# Patient Record
Sex: Male | Born: 1985 | Race: Black or African American | Hispanic: No | Smoking: Current every day smoker
Health system: Southern US, Community
[De-identification: ages and names within clinical notes are randomized; demographics above are authoritative.]

---

## 2018-03-19 ENCOUNTER — Emergency Department (HOSPITAL_COMMUNITY): Payer: Self-pay

## 2018-03-19 ENCOUNTER — Emergency Department (HOSPITAL_COMMUNITY)
Admission: EM | Admit: 2018-03-19 | Discharge: 2018-03-19 | Disposition: A | Discharge status: Home / Self Care | Payer: Self-pay | Attending: Emergency Medicine | Admitting: Emergency Medicine

## 2018-03-19 ENCOUNTER — Encounter (HOSPITAL_COMMUNITY): Payer: Self-pay | Admitting: Emergency Medicine

## 2018-03-19 ENCOUNTER — Other Ambulatory Visit: Payer: Self-pay

## 2018-03-19 DIAGNOSIS — F17210 Nicotine dependence, cigarettes, uncomplicated: Secondary | ICD-10-CM | POA: Insufficient documentation

## 2018-03-19 DIAGNOSIS — R042 Hemoptysis: Secondary | ICD-10-CM

## 2018-03-19 DIAGNOSIS — J069 Acute upper respiratory infection, unspecified: Secondary | ICD-10-CM | POA: Insufficient documentation

## 2018-03-19 DIAGNOSIS — B9789 Other viral agents as the cause of diseases classified elsewhere: Secondary | ICD-10-CM

## 2018-03-19 MED ORDER — FLUTICASONE PROPIONATE 50 MCG/ACT NA SUSP: 2.0000 | Freq: Every day | NASAL | 0 refills | Status: AC

## 2018-03-19 MED ORDER — BENZONATATE 100 MG PO CAPS: 100.0000 mg | ORAL_CAPSULE | Freq: Three times a day (TID) | ORAL | 0 refills | Status: AC | PRN

## 2018-03-19 NOTE — Discharge Instructions (Addendum)
Drink plenty fluids and get plenty of rest.  Use Flonase for nasal congestion and Tessalon as needed for cough.  Try to quit smoking if you can.  He can also use over-the-counter cold medicines that may be helpful for your cough and nasal congestion.  Wear protective equipment while at work. Follow-up with a primary care physician for reevaluation of your symptoms.  I have attached information for [facility].  You can call them tomorrow morning and tell them you were referred from the emergency department.  Return to the emergency department if any concerning signs or symptoms develop such as fevers, chest pains, worsening shortness of breath

## 2018-03-19 NOTE — ED Provider Notes (Signed)
[facility] EMERGENCY DEPARTMENT Provider Note   CSN: [id number] Arrival date & time: 03/19/18  1654     History   Chief Complaint Chief Complaint  Patient presents with  . Cough    HPI Todd Bush is a 32 y.o. male with no significant past medical history presents today for evaluation of acute onset, persistent cough for 3 weeks.  He notes cough is productive of yellow sputum occasionally with black specks.  He states that after particularly aggressive/persistent coughing fits he will sometimes cough blood-streaked sputum.  He will feel short of breath during these coughing fits but denies shortness of breath otherwise.  He endorses generalized myalgias and aching soreness of the anterior chest wall with cough only.  Denies fevers, chills, nausea, or vomiting.  He does endorse mild sore throat and nasal congestion.  States that his family member had similar symptoms but not to this extent.  He has not tried anything for his symptoms.  He does work around a lot of dust and uses chemicals at work but does not wear a mask.  He is a current smoker of 4 to 5 cigarettes daily.  Denies recent travel or surgeries, no history of DVT or PE, not on hormonal placement therapy.  The history is provided by the patient.    History reviewed. No pertinent past medical history.  There are no active problems to display for this patient.   History reviewed. No pertinent surgical history.      Home Medications    Prior to Admission medications   Medication Sig Start Date End Date Taking? Authorizing Provider  benzonatate (TESSALON) 100 MG capsule Take 1 capsule (100 mg total) by mouth 3 (three) times daily as needed for cough. 03/19/18   Landers Prajapati A, PA-C  fluticasone (FLONASE) 50 MCG/ACT nasal spray Place 2 sprays into both nostrils daily. 03/19/18   Jeanie Sewer, PA-C    Family History No family history on file.  Social History Social History   Tobacco Use  .  Smoking status: Current Every Day Smoker  . Smokeless tobacco: Never Used  Substance Use Topics  . Alcohol use: Not Currently  . Drug use: Not on file     Allergies   Patient has no known allergies.   Review of Systems Review of Systems  Constitutional: Negative for chills and fever.  HENT: Positive for congestion and sore throat.   Respiratory: Positive for cough and shortness of breath (with cough only).   Cardiovascular: Positive for chest pain (with cough only).  All other systems reviewed and are negative.     Physical Exam Updated Vital Signs BP (!) 134/93 (BP Location: Right Arm)   Pulse (!) 51   Temp 98.2 F (36.8 C) (Oral)   Resp 12   Ht 5\' 7"  (1.702 m)   Wt 81.6 kg   SpO2 100%   BMI 28.19 kg/m   Physical Exam  Constitutional: He appears well-developed and well-nourished. No distress.  Resting comfortably in chair in no apparent distress  HENT:  Head: Normocephalic and atraumatic.  Right Ear: Tympanic membrane, external ear and ear canal normal.  Left Ear: Tympanic membrane, external ear and ear canal normal.  Nose: Mucosal edema present. No sinus tenderness or septal deviation. Right sinus exhibits no maxillary sinus tenderness and no frontal sinus tenderness. Left sinus exhibits no maxillary sinus tenderness and no frontal sinus tenderness.  Mouth/Throat: Uvula is midline and oropharynx is clear and moist. No trismus in the  jaw. No tonsillar exudate.  No tonsillar hypertrophy, no exudates, tolerating secretions without difficulty.  No sublingual, subglossal, or submental abnormalities.  Eyes: Pupils are equal, round, and reactive to light. Conjunctivae and EOM are normal. Right eye exhibits no discharge. Left eye exhibits no discharge.  Neck: Normal range of motion. Neck supple. No JVD present. No tracheal deviation present.  Cardiovascular: Normal rate, regular rhythm and normal heart sounds.  Pulmonary/Chest: Effort normal and breath sounds normal. No  stridor. No respiratory distress. He has no wheezes. He has no rales. He exhibits no tenderness.  Equal rise and fall of chest, no increased work of breathing.  Speaking in full sentences without difficulty.  Abdominal: Soft. Bowel sounds are normal. He exhibits no distension. There is no tenderness. There is no guarding.  Musculoskeletal: He exhibits no edema.  Lymphadenopathy:    He has no cervical adenopathy.  Neurological: He is alert.  Skin: Skin is warm and dry. No erythema.  Psychiatric: He has a normal mood and affect. His behavior is normal.  Nursing note and vitals reviewed.    ED Treatments / Results  Labs (all labs ordered are listed, but only abnormal results are displayed) Labs Reviewed - No data to display  EKG None  Radiology Dg Chest 2 View  Result Date: 03/19/2018 CLINICAL DATA:  Productive cough and central chest pain. EXAM: CHEST - 2 VIEW COMPARISON:  None. FINDINGS: Cardiomediastinal silhouette is normal. Mediastinal contours appear intact. There is no evidence of focal airspace consolidation, pleural effusion or pneumothorax. Osseous structures are without acute abnormality. Soft tissues are grossly normal. IMPRESSION: No active cardiopulmonary disease. Electronically Signed   By: Ted Mcalpineobrinka  Dimitrova M.D.   On: 03/19/2018 18:28    Procedures Procedures (including critical care time)  Medications Ordered in ED Medications - No data to display   Initial Impression / Assessment and Plan / ED Course  I have reviewed the triage vital signs and the nursing notes.  Pertinent labs & imaging results that were available during my care of the patient were reviewed by me and considered in my medical decision making (see chart for details).     Patient with complaint of productive cough and nasal congestion for 3 weeks.  He is afebrile, vital signs are stable.  He is nontoxic in appearance.  Lungs clear to auscultation.  He exhibits no increased work of breathing,  speaking in full sentences without difficulty.  He is PERC negative and I suspect that the blood streaking with cough is epithelial injury secondary to persistent cough.  Doubt PE, pneumonia, pleural effusion, dissection, pericarditis, myocarditis.  Chest pain appears to be musculoskeletal in etiology secondary to persistent cough, doubt ACS/MI.  Pt CXR negative for acute infiltrate. Patients symptoms are consistent with URI, likely viral etiology. Discussed that antibiotics are not indicated for viral infections. Pt will be discharged with symptomatic treatment.  Encourage smoking cessation.  Recommend follow-up with PCP.  He was given information for [facility].  Discussed strict ED return precautions.  Patient and patient's wife verbalized understanding of and agreement with plan and patient is stable for discharge home at this time.  Final Clinical Impressions(s) / ED Diagnoses   Final diagnoses:  Cough with hemoptysis  Viral URI with cough    ED Discharge Orders         Ordered    fluticasone (FLONASE) 50 MCG/ACT nasal spray  Daily     03/19/18 2014    benzonatate (TESSALON) 100 MG capsule  3 times daily PRN     03/19/18 2014           Bennye AlmFawze, Bobbie Valletta A, PA-C 03/19/18 2036    Wynetta FinesMessick, Peter C, MD 03/20/18 0001

## 2018-03-19 NOTE — ED Notes (Signed)
Patient verbalizes understanding of discharge instructions. Opportunity for questioning and answers were provided. Armband removed by staff, pt discharged from ED ambulatory.   

## 2018-03-19 NOTE — ED Triage Notes (Signed)
Pt has been coughing up blood in a coughing "fit" for 3 weeks. Pt states he feels normal aside from cough.

## 2018-03-19 NOTE — ED Provider Notes (Signed)
Patient placed in Quick Look pathway, seen and evaluated   Chief Complaint: cough  HPI:  Todd Bush is a 32 y.o. male who presents to the ED with a cough. The cough started 3 weeks ago. Patient reports coughing very hard and having thick yellow sputum followed by blood. Patient reports he works in a lot of dust and uses chemicals at work and does not wear a mask. Patient reports pain in the chest with cough only and reports that otherwise he feels normal.   ROS: Resp: cough  Physical Exam:  Pulse 78   Temp 98.2 F (36.8 C) (Oral)   Resp 20   Ht 5\' 7"  (1.702 m)   Wt 81.6 kg   SpO2 100%   BMI 28.19 kg/m    Gen: No distress  Neuro: Awake and Alert  Skin: Warm and dry  Lungs: occasional rales RLL  Heart: regular rate and rhythm    Initiation of care has begun. The patient has been counseled on the process, plan, and necessity for staying for the completion/evaluation, and the remainder of the medical screening examination    Janne Napoleoneese, Hope M, NP 03/19/18 1732    Lorre NickAllen, Anthony, MD 03/19/18 2240

## 2018-07-07 ENCOUNTER — Encounter (HOSPITAL_COMMUNITY): Payer: Self-pay

## 2018-07-07 ENCOUNTER — Emergency Department (HOSPITAL_COMMUNITY)
Admission: EM | Admit: 2018-07-07 | Discharge: 2018-07-07 | Disposition: A | Discharge status: Home / Self Care | Payer: Self-pay | Attending: Emergency Medicine | Admitting: Emergency Medicine

## 2018-07-07 ENCOUNTER — Other Ambulatory Visit: Payer: Self-pay

## 2018-07-07 DIAGNOSIS — Y999 Unspecified external cause status: Secondary | ICD-10-CM | POA: Insufficient documentation

## 2018-07-07 DIAGNOSIS — Z23 Encounter for immunization: Secondary | ICD-10-CM | POA: Insufficient documentation

## 2018-07-07 DIAGNOSIS — Y929 Unspecified place or not applicable: Secondary | ICD-10-CM | POA: Insufficient documentation

## 2018-07-07 DIAGNOSIS — Z79899 Other long term (current) drug therapy: Secondary | ICD-10-CM | POA: Insufficient documentation

## 2018-07-07 DIAGNOSIS — S1191XA Laceration without foreign body of unspecified part of neck, initial encounter: Secondary | ICD-10-CM | POA: Insufficient documentation

## 2018-07-07 DIAGNOSIS — Y9389 Activity, other specified: Secondary | ICD-10-CM | POA: Insufficient documentation

## 2018-07-07 DIAGNOSIS — F17200 Nicotine dependence, unspecified, uncomplicated: Secondary | ICD-10-CM | POA: Insufficient documentation

## 2018-07-07 MED ORDER — LIDOCAINE-EPINEPHRINE-TETRACAINE (LET) SOLUTION
3.0000 mL | Freq: Once | NASAL | Status: AC
  Administered 2018-07-07: 3 mL via TOPICAL
  Filled 2018-07-07: qty 3

## 2018-07-07 MED ORDER — TETANUS-DIPHTH-ACELL PERTUSSIS 5-2.5-18.5 LF-MCG/0.5 IM SUSP
0.5000 mL | Freq: Once | INTRAMUSCULAR | Status: AC
  Administered 2018-07-07: 0.5 mL via INTRAMUSCULAR
  Filled 2018-07-07: qty 0.5
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
## 2018-07-07 NOTE — ED Notes (Signed)
Patient verbalizes understanding of discharge instructions. Opportunity for questioning and answers were provided. Armband removed by staff, pt discharged from ED. Dressing applied, supplies given for home. Pt ambulatory.
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
## 2018-07-07 NOTE — Discharge Instructions (Addendum)
recommend wound check in 2 days.  Return to the ER for any concerning symptoms.

## 2018-07-07 NOTE — ED Notes (Signed)
LET applied.

## 2018-07-08 ENCOUNTER — Emergency Department (HOSPITAL_COMMUNITY)
Admission: EM | Admit: 2018-07-08 | Discharge: 2018-07-08 | Disposition: A | Discharge status: Home / Self Care | Payer: Self-pay | Attending: Emergency Medicine | Admitting: Emergency Medicine

## 2018-07-08 ENCOUNTER — Emergency Department (HOSPITAL_COMMUNITY): Payer: Self-pay

## 2018-07-08 DIAGNOSIS — Y939 Activity, unspecified: Secondary | ICD-10-CM | POA: Insufficient documentation

## 2018-07-08 DIAGNOSIS — Y929 Unspecified place or not applicable: Secondary | ICD-10-CM | POA: Insufficient documentation

## 2018-07-08 DIAGNOSIS — S1191XD Laceration without foreign body of unspecified part of neck, subsequent encounter: Secondary | ICD-10-CM | POA: Insufficient documentation

## 2018-07-08 DIAGNOSIS — F17200 Nicotine dependence, unspecified, uncomplicated: Secondary | ICD-10-CM | POA: Insufficient documentation

## 2018-07-08 DIAGNOSIS — Y998 Other external cause status: Secondary | ICD-10-CM | POA: Insufficient documentation

## 2018-07-08 LAB — CBC
HEMATOCRIT: 43.7 % (ref 39.0–52.0)
Hemoglobin: 13.6 g/dL (ref 13.0–17.0)
MCH: 31.4 pg (ref 26.0–34.0)
MCHC: 31.1 g/dL (ref 30.0–36.0)
MCV: 100.9 fL — AB (ref 80.0–100.0)
Platelets: 231 10*3/uL (ref 150–400)
RBC: 4.33 MIL/uL (ref 4.22–5.81)
RDW: 12.2 % (ref 11.5–15.5)
WBC: 8 10*3/uL (ref 4.0–10.5)
nRBC: 0 % (ref 0.0–0.2)

## 2018-07-08 LAB — BASIC METABOLIC PANEL
ANION GAP: 11 (ref 5–15)
BUN: 8 mg/dL (ref 6–20)
CALCIUM: 9.2 mg/dL (ref 8.9–10.3)
CO2: 21 mmol/L — AB (ref 22–32)
Chloride: 106 mmol/L (ref 98–111)
Creatinine, Ser: 0.94 mg/dL (ref 0.61–1.24)
GFR calc Af Amer: >60 mL/min (ref >60)
GFR calc non Af Amer: >60 mL/min (ref >60)
GLUCOSE: 91 mg/dL (ref 70–99)
POTASSIUM: 3.5 mmol/L (ref 3.5–5.1)
Sodium: 138 mmol/L (ref 135–145)

## 2018-07-08 MED ORDER — LIDOCAINE-EPINEPHRINE (PF) 2 %-1:200000 IJ SOLN
20.0000 mL | Freq: Once | INTRAMUSCULAR | Status: AC
  Administered 2018-07-08: 20 mL
  Filled 2018-07-08: qty 20

## 2018-07-08 MED ORDER — IOPAMIDOL (ISOVUE-370) INJECTION 76%
75.0000 mL | Freq: Once | INTRAVENOUS | Status: AC | PRN
  Administered 2018-07-08: 01:00:00 via INTRAVENOUS
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
## 2018-07-08 NOTE — ED Notes (Signed)
Patient verbalizes understanding of discharge instructions. Opportunity for questioning and answers were provided. Armband removed by staff, pt discharged from ED ambulatory.   

## 2018-07-08 NOTE — ED Notes (Signed)
Quick clot and new pressure dressing applied. Pt transported to CT angio
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
## 2020-01-09 IMAGING — DX DG CHEST 2V
2 series · 2 of 2 positions shown · non-contrast
Comparison: None.

CLINICAL DATA: Productive cough and central chest pain.

EXAM:
CHEST - 2 VIEW

[w chest pa]
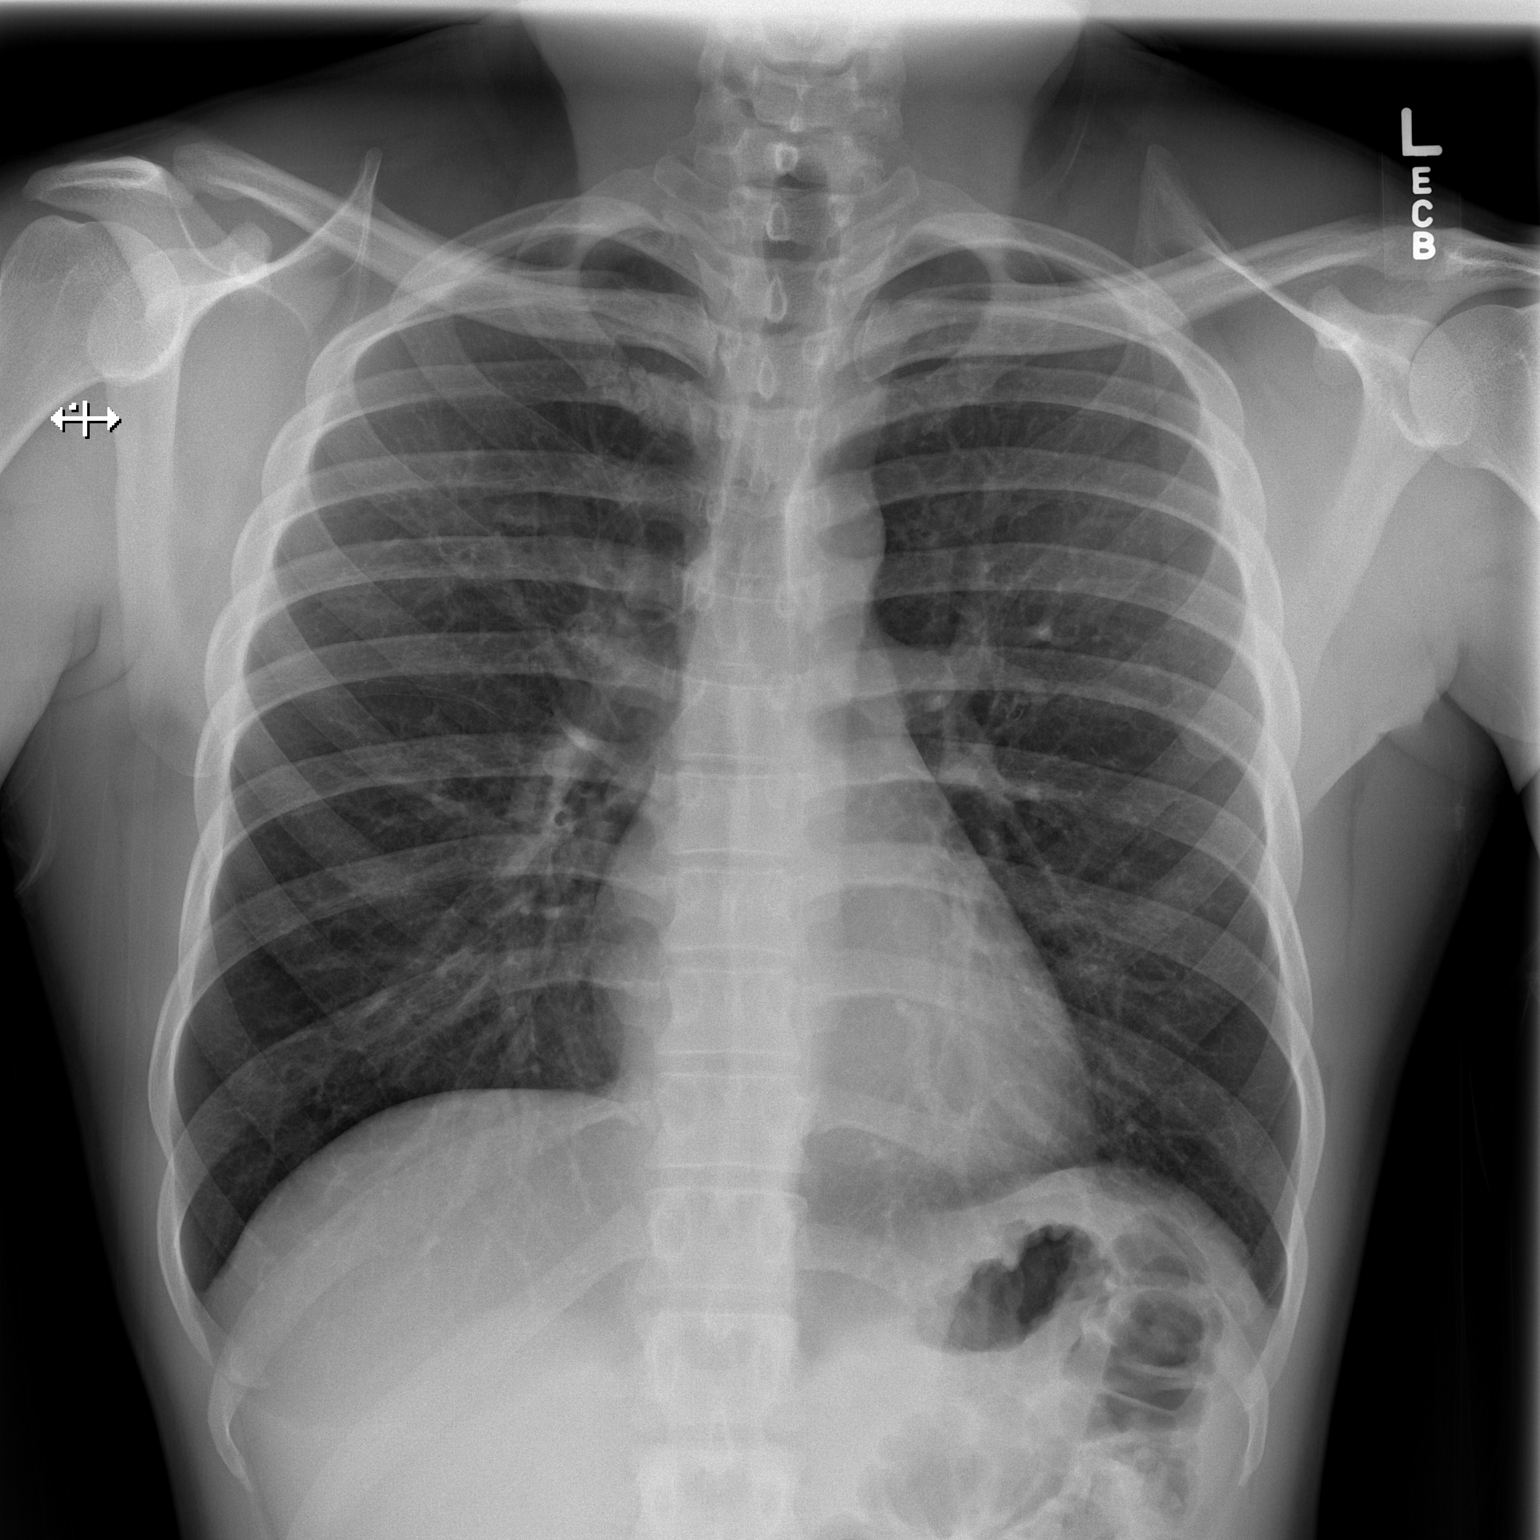

[w chest lat]
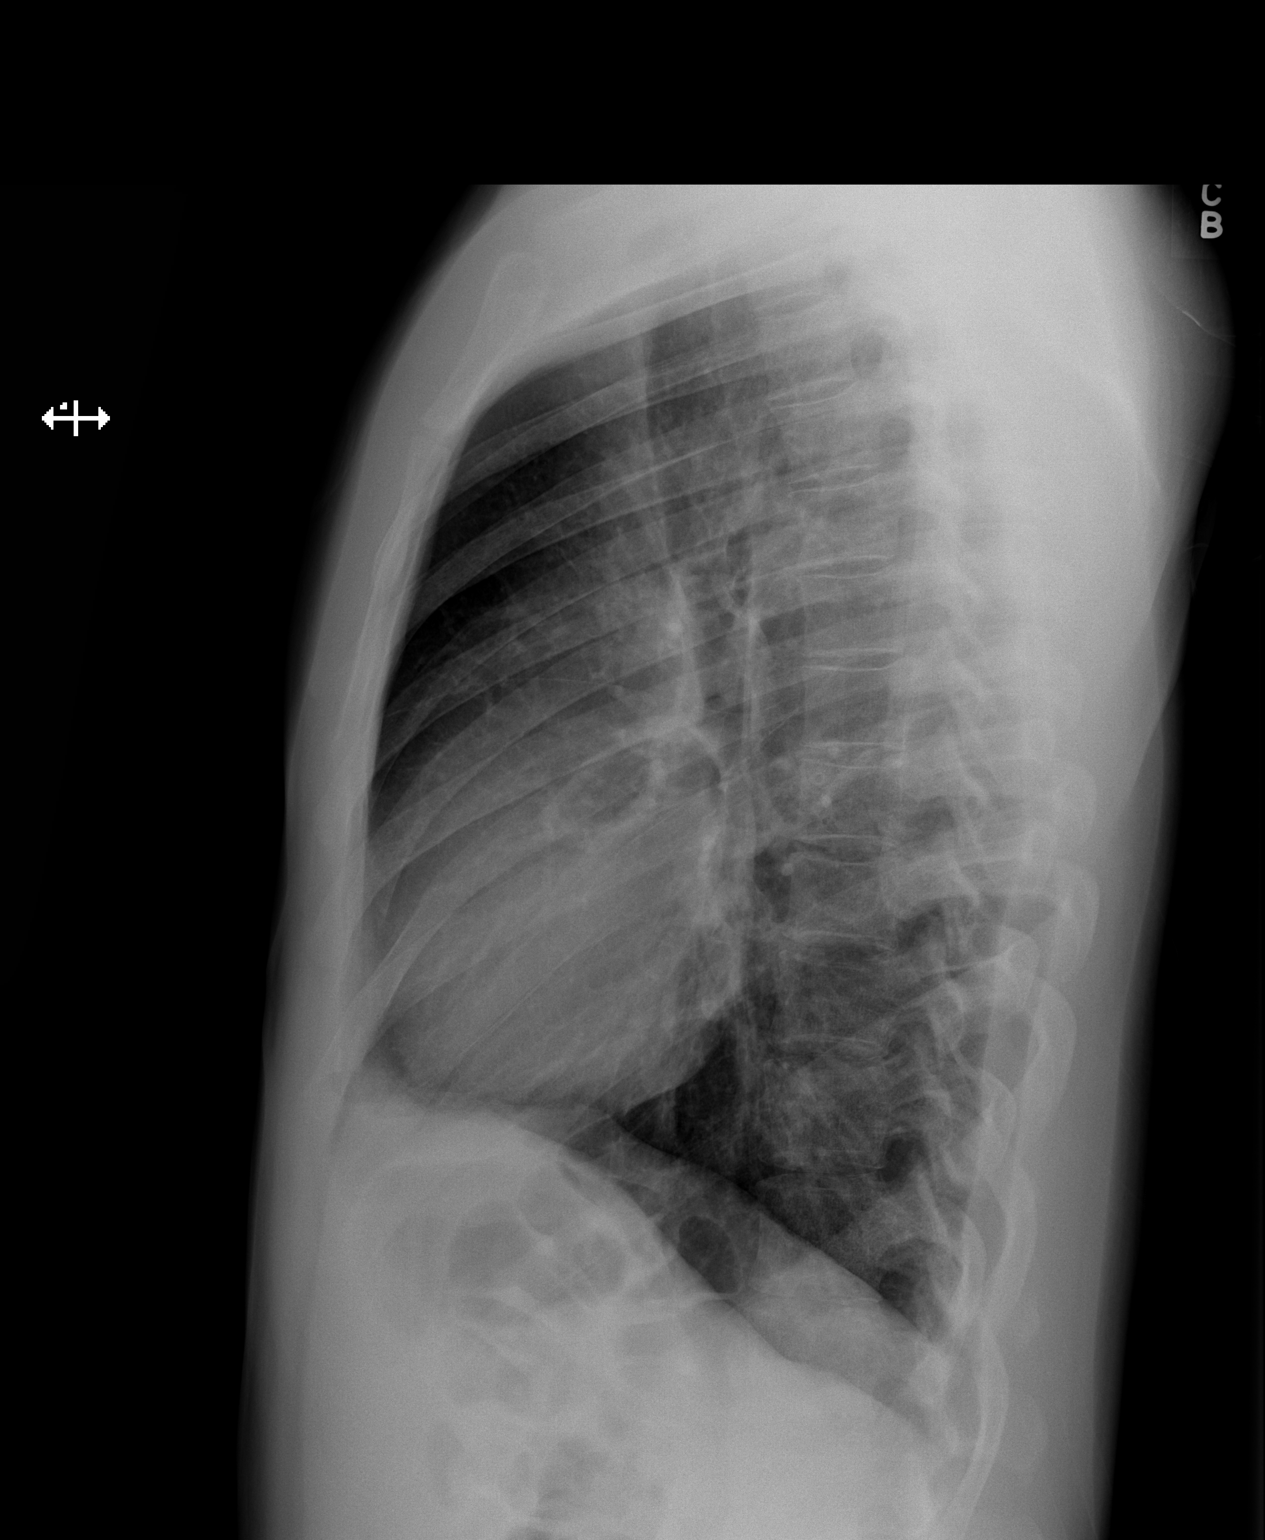

[2 of 2 positions shown; findings below may reference images not displayed]

FINDINGS: Cardiomediastinal silhouette is normal. Mediastinal contours appear
intact.

There is no evidence of focal airspace consolidation, pleural
effusion or pneumothorax.

Osseous structures are without acute abnormality. Soft tissues are
grossly normal.
IMPRESSION: No active cardiopulmonary disease.
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
